# Patient Record
Sex: Male | Born: 1990 | Hispanic: No | Marital: Single | Smoking: Never smoker
Health system: Southern US, Community
[De-identification: ages and names within clinical notes are randomized; demographics above are authoritative.]

## PROBLEM LIST (undated history)

## (undated) DIAGNOSIS — K219 Gastro-esophageal reflux disease without esophagitis: Secondary | ICD-10-CM

---

## 2013-01-08 ENCOUNTER — Emergency Department (HOSPITAL_COMMUNITY): Payer: Worker's Compensation

## 2013-01-08 ENCOUNTER — Encounter (HOSPITAL_COMMUNITY): Payer: Self-pay

## 2013-01-08 ENCOUNTER — Emergency Department (HOSPITAL_COMMUNITY)
Admission: EM | Admit: 2013-01-08 | Discharge: 2013-01-08 | Disposition: A | Discharge status: Home / Self Care | Payer: Worker's Compensation | Attending: Emergency Medicine | Admitting: Emergency Medicine

## 2013-01-08 DIAGNOSIS — R296 Repeated falls: Secondary | ICD-10-CM | POA: Insufficient documentation

## 2013-01-08 DIAGNOSIS — Y9339 Activity, other involving climbing, rappelling and jumping off: Secondary | ICD-10-CM | POA: Insufficient documentation

## 2013-01-08 DIAGNOSIS — S82209A Unspecified fracture of shaft of unspecified tibia, initial encounter for closed fracture: Secondary | ICD-10-CM | POA: Insufficient documentation

## 2013-01-08 DIAGNOSIS — S82201A Unspecified fracture of shaft of right tibia, initial encounter for closed fracture: Secondary | ICD-10-CM

## 2013-01-08 DIAGNOSIS — Y929 Unspecified place or not applicable: Secondary | ICD-10-CM | POA: Insufficient documentation

## 2013-01-08 LAB — BASIC METABOLIC PANEL
BUN: 20 mg/dL (ref 6–23)
Calcium: 9.1 mg/dL (ref 8.4–10.5)
GFR calc non Af Amer: >90 mL/min (ref >90)
Glucose, Bld: 135 mg/dL — ABNORMAL HIGH (ref 70–99)
Sodium: 140 mEq/L (ref 135–145)

## 2013-01-08 LAB — CBC WITH DIFFERENTIAL/PLATELET
Eosinophils Absolute: 0 10*3/uL (ref 0.0–0.7)
Eosinophils Relative: 0 % (ref 0–5)
Lymphs Abs: 1.4 10*3/uL (ref 0.7–4.0)
MCH: 30.4 pg (ref 26.0–34.0)
MCV: 88.2 fL (ref 78.0–100.0)
Platelets: 275 10*3/uL (ref 150–400)
RBC: 4.93 MIL/uL (ref 4.22–5.81)

## 2013-01-08 MED ORDER — OXYCODONE-ACETAMINOPHEN 5-325 MG PO TABS: 2.0000 | ORAL_TABLET | ORAL | Status: DC | PRN

## 2013-01-08 MED ORDER — HYDROMORPHONE HCL PF 1 MG/ML IJ SOLN
1.0000 mg | Freq: Once | INTRAMUSCULAR | Status: AC
  Administered 2013-01-08: 1 mg via INTRAVENOUS
  Filled 2013-01-08: qty 1

## 2013-01-08 MED ORDER — SODIUM CHLORIDE 0.9 % IV BOLUS (SEPSIS)
500.0000 mL | Freq: Once | INTRAVENOUS | Status: AC
  Administered 2013-01-08: 500 mL via INTRAVENOUS

## 2013-01-08 MED ORDER — OXYCODONE-ACETAMINOPHEN 5-325 MG PO TABS: 1.0000 | ORAL_TABLET | Freq: Four times a day (QID) | ORAL | Status: DC | PRN

## 2013-01-08 MED ORDER — ONDANSETRON HCL 4 MG/2ML IJ SOLN
INTRAMUSCULAR | Status: AC
  Administered 2013-01-08: 4 mg
  Filled 2013-01-08: qty 2
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
## 2013-01-08 NOTE — ED Notes (Signed)
Dr. Rubin Payor at bedside speaking to patient via translator telephone. Er tech at bedside to apply splint.

## 2013-01-08 NOTE — ED Notes (Signed)
NPO.  Last meal at 11 am today per interpreter at bedside.  No allergies, no medications, no medical history.

## 2013-01-09 ENCOUNTER — Encounter (HOSPITAL_COMMUNITY): Payer: Self-pay | Admitting: Pharmacy Technician

## 2013-01-09 ENCOUNTER — Encounter: Payer: Self-pay | Admitting: Orthopedic Surgery

## 2013-01-09 ENCOUNTER — Encounter (HOSPITAL_COMMUNITY): Payer: Self-pay

## 2013-01-09 ENCOUNTER — Ambulatory Visit (INDEPENDENT_AMBULATORY_CARE_PROVIDER_SITE_OTHER): Payer: Worker's Compensation | Admitting: Orthopedic Surgery

## 2013-01-09 ENCOUNTER — Telehealth: Payer: Self-pay | Admitting: Orthopedic Surgery

## 2013-01-09 ENCOUNTER — Encounter (HOSPITAL_COMMUNITY)
Admission: RE | Admit: 2013-01-09 | Discharge: 2013-01-09 | Disposition: A | Discharge status: Home / Self Care | Payer: Worker's Compensation | Source: Ambulatory Visit | Attending: Orthopedic Surgery | Admitting: Orthopedic Surgery

## 2013-01-09 VITALS — BP 132/78 | Wt 180.0 lb

## 2013-01-09 DIAGNOSIS — S82209A Unspecified fracture of shaft of unspecified tibia, initial encounter for closed fracture: Secondary | ICD-10-CM

## 2013-01-09 DIAGNOSIS — S82201A Unspecified fracture of shaft of right tibia, initial encounter for closed fracture: Secondary | ICD-10-CM
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
## 2013-01-09 NOTE — Patient Instructions (Addendum)
Fractura de tibia y peron en adultos (Tibial and Fibular Fracture, Adult) Usted ha sufrido una fractura (quebradura de hueso) en la tibia y el peron. La tibia es el hueso grande o "canilla" que se encuentra en la parte inferior de la pierna. Es el principal hueso que soporta su peso. El peron es el hueso de la parte externa de la pierna y el que forma el bulto en la parte exterior del [place]. Estas fracturas se diagnostican con radiografas. TRATAMIENTO Usted ha sufrido una fractura simple, que generalmente se cura sin causar ninguna discapacidad. Pero puede tratarse con una inmovilizacin simple. Esto significa que el hueso puede ser ayudado a curarse con un yeso o tablilla en una posicin favorable, hasta que el mdico considere que se encuentra estable (que se ha curado bien) para Conservator, museum/gallery. Luego puede comenzar con ejercicios de amplitud de movimiento para Pharmacologist la movilidad de la rodilla. INSTRUCCIONES PARA EL CUIDADO DOMICILIARIO  Aplique el hielo en la lesin durante 15 a 20 minutos, 3 a 4 veces por da [address] est despierto, [employer]. Coloque el hielo en una bolsa plstica y ponga una toalla delgada entre la bolsa y [place].  Si usted tiene un yeso o fibra de vidrio:  No trate de rascarse la piel por debajo del molde utilizando objetos filosos o puntiagudos.  Controle todos los [employer] piel de alrededor del yeso. Puede colocarse una locin en las zonas rojas o doloridas.  Mantenga el yeso seco y limpio.  Si usted tiene una tablilla de yeso.  sela del modo en que se lo indicaron.  Puede aflojar el elstico que rodea la tablilla si los dedos se entumecen, siente hormigueos, se enfran o se vuelven de color azul.  No ejerza presin en ninguna parte del yeso o tablilla hasta que se haya endurecido, ya que podra deformarse.  Puede proteger el yeso o la tablilla durante el bao con una bolsa plstica. No los sumerja en el agua.  Utilice las [employer]  del modo en que se lo indicaron.  Slo tome medicamentos de venta libre o prescriptos para Primary school teacher, las [place], o bajar la fiebre segn las indicaciones de su mdico.  Siga todas las instrucciones del profesional que lo asiste, concierte y cumpla con las citas y use las [employer] se le indic.  Concurra a la Training and development officer profesional que lo asiste de acuerdo a lo que le haya indicado. Es muy importante concurrir a todas las interconsultas y citas con el objeto de Automotive engineer problemas a Air cabin crew en la pierna y la rodilla, como dolor crnico o imposibilidad de Licensed conveyancer tobillo normalmente, el fracaso en la curacin de la fractura o una discapacidad [place]. SOLICITE ATENCIN MDICA DE INMEDIATO SI:  El dolor empeora en lugar de mejorar, o si el dolor no es controlable con los medicamentos.  Aumenta la hinchazn o el enrojecimiento en el pie.  Comienza a perder la sensibilidad en el pie o en los dedos.  El pie o los dedos del lado lesionado se tornan fros o de color azul  Siente dolor intenso en la pierna lesionada, especialmente si aumenta el dolor con el movimiento de los dedos. Document Released: 02/08/2005 Document Revised: 07/24/2011 [facility] Patient Information 2014 [place], [place]. Tibial Fracture, Adult You have a fracture (break in bone) of your tibia. This is the large "shin" bone in your lower leg. These fractures are easily diagnosed with x-rays. TREATMENT  You have a simple fracture which  usually will heal without disability. It can be treated with simple immobilization. This means the bone can be held with a cast or splint in a favorable position until your caregiver feels it is stable (healed well enough). Then you can begin range of motion exercises to keep your knee and ankle limber (moving well). HOME CARE INSTRUCTIONS   Apply ice to the injury for 15-20 minutes, 3-4 times per day while awake, for 2 days. Put the ice in a plastic bag and place a thin towel  between the bag of ice and your cast.  If you have a plaster or fiberglass cast:  Do not try to scratch the skin under the cast using sharp or pointed objects.  Check the skin around the cast every day. You may put lotion on any red or sore areas.  Keep your cast dry and clean.  If you have a plaster splint:  Wear the splint as directed.  You may loosen the elastic around the splint if your toes become numb, tingle, or turn cold or blue.  Do not put pressure on any part of your cast or splint until it is fully hardened.  Your cast or splint can be protected during bathing with a plastic bag. Do not lower the cast or splint into water.  Use crutches as directed.  Only take over-the-counter or prescription medicines for pain, discomfort, or fever as directed by your caregiver.  See your caregiver as directed. It is very important to keep all follow-up referrals and appointments in order to avoid any long-term problems with your leg and ankle including chronic pain, inability to move the ankle normally, failure of the fracture to heal and permanent disability. SEEK IMMEDIATE MEDICAL CARE IF:   Pain is becoming worse rather than better, or if pain is uncontrolled with medications.  You have increased swelling, pain, or redness in the foot.  You begin to lose feeling in your foot or toes.  You develop a cold or blue foot or toes on the injured side.  You develop severe pain in your injured leg, especially if it is increased with movement of your toes. Document Released: 01/24/2001 Document Revised: 07/24/2011 Document Reviewed: 08/18/2008 [facility] Patient Information 2014 [place], [place].  Terry Reeves  01/09/2013   Your procedure is scheduled on:  01/10/13  Report to [facility] at 1100 AM.  Call this number if you have problems the morning of surgery: [phone]   Remember:   Do not eat food or drink liquids after midnight.   Take these medicines the morning of  surgery with A SIP OF WATER: pain pill if needed   Do not wear jewelry, make-up or nail polish.  Do not wear lotions, powders, or perfumes. You may wear deodorant.  Do not shave 48 hours prior to surgery. Men may shave face and neck.  Do not bring valuables to the hospital.  [facility] is not responsible                   for any belongings or valuables.  Contacts, dentures or bridgework may not be worn into surgery.  Leave suitcase in the car. After surgery it may be brought to your room.  For patients admitted to the hospital, checkout time is 11:00 AM the day of  discharge.   Patients discharged the day of surgery will not be allowed to drive  home.  Name and phone number of your driver: friend  Special Instructions: Shower using CHG  2 nights before surgery and the night before surgery.  If you shower the day of surgery use CHG.  Use special wash - you have one bottle of CHG for all showers.  You should use approximately 1/3 of the bottle for each shower.   Please read over the following fact sheets that you were given: Pain Booklet, Anesthesia Post-op Instructions and Care and Recovery After Surgery
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
## 2013-01-10 ENCOUNTER — Encounter (HOSPITAL_COMMUNITY): Payer: Self-pay | Admitting: *Deleted

## 2013-01-10 ENCOUNTER — Ambulatory Visit (HOSPITAL_COMMUNITY): Payer: Worker's Compensation

## 2013-01-10 ENCOUNTER — Encounter (HOSPITAL_COMMUNITY)
Admission: RE | Disposition: A | Discharge status: Home / Self Care | Payer: Self-pay | Source: Ambulatory Visit | Attending: Orthopedic Surgery

## 2013-01-10 ENCOUNTER — Encounter (HOSPITAL_COMMUNITY): Payer: Self-pay | Admitting: Anesthesiology

## 2013-01-10 ENCOUNTER — Ambulatory Visit (HOSPITAL_COMMUNITY): Payer: Worker's Compensation | Admitting: Anesthesiology

## 2013-01-10 ENCOUNTER — Inpatient Hospital Stay (HOSPITAL_COMMUNITY)
Admission: RE | Admit: 2013-01-10 | Discharge: 2013-01-11 | DRG: 494 | Disposition: A | Discharge status: Home / Self Care | Payer: Worker's Compensation | Source: Ambulatory Visit | Attending: Orthopedic Surgery | Admitting: Orthopedic Surgery

## 2013-01-10 DIAGNOSIS — Z8249 Family history of ischemic heart disease and other diseases of the circulatory system: Secondary | ICD-10-CM

## 2013-01-10 DIAGNOSIS — Y990 Civilian activity done for income or pay: Secondary | ICD-10-CM

## 2013-01-10 DIAGNOSIS — S82201A Unspecified fracture of shaft of right tibia, initial encounter for closed fracture: Secondary | ICD-10-CM

## 2013-01-10 DIAGNOSIS — S82209A Unspecified fracture of shaft of unspecified tibia, initial encounter for closed fracture: Principal | ICD-10-CM

## 2013-01-10 HISTORY — PX: TIBIA IM NAIL INSERTION: SHX2516

## 2013-01-10 HISTORY — DX: Gastro-esophageal reflux disease without esophagitis: K21.9

## 2013-01-10 LAB — CBC
MCH: 30.7 pg (ref 26.0–34.0)
MCHC: 34.6 g/dL (ref 30.0–36.0)
MCV: 88.8 fL (ref 78.0–100.0)
Platelets: 237 10*3/uL (ref 150–400)
RBC: 4.75 MIL/uL (ref 4.22–5.81)

## 2013-01-10 LAB — CREATININE, SERUM: Creatinine, Ser: 0.95 mg/dL (ref 0.50–1.35)

## 2013-01-10 SURGERY — INSERTION, INTRAMEDULLARY ROD, TIBIA
Anesthesia: General | Site: Leg Lower | Laterality: Right | Wound class: Clean

## 2013-01-10 MED ORDER — ALUM & MAG HYDROXIDE-SIMETH 200-200-20 MG/5ML PO SUSP: 30.0000 mL | ORAL | Status: DC | PRN

## 2013-01-10 MED ORDER — ENOXAPARIN SODIUM 40 MG/0.4ML ~~LOC~~ SOLN
40.0000 mg | SUBCUTANEOUS | Status: DC
  Administered 2013-01-11: 40 mg via SUBCUTANEOUS
  Filled 2013-01-10: qty 0.4

## 2013-01-10 MED ORDER — CHLORHEXIDINE GLUCONATE 4 % EX LIQD: 60.0000 mL | Freq: Once | CUTANEOUS | Status: DC

## 2013-01-10 MED ORDER — MENTHOL 3 MG MT LOZG: 1.0000 | LOZENGE | OROMUCOSAL | Status: DC | PRN

## 2013-01-10 MED ORDER — PHENOL 1.4 % MT LIQD: 1.0000 | OROMUCOSAL | Status: DC | PRN

## 2013-01-10 MED ORDER — METOCLOPRAMIDE HCL 5 MG/ML IJ SOLN: 5.0000 mg | Freq: Three times a day (TID) | INTRAMUSCULAR | Status: DC | PRN

## 2013-01-10 MED ORDER — BUPIVACAINE-EPINEPHRINE PF 0.5-1:200000 % IJ SOLN
INTRAMUSCULAR | Status: AC
  Filled 2013-01-10: qty 20

## 2013-01-10 MED ORDER — ACETAMINOPHEN 325 MG PO TABS: 650.0000 mg | ORAL_TABLET | Freq: Four times a day (QID) | ORAL | Status: DC | PRN

## 2013-01-10 MED ORDER — PROPOFOL 10 MG/ML IV EMUL
INTRAVENOUS | Status: AC
  Filled 2013-01-10: qty 20

## 2013-01-10 MED ORDER — ACETAMINOPHEN 650 MG RE SUPP: 650.0000 mg | Freq: Four times a day (QID) | RECTAL | Status: DC | PRN

## 2013-01-10 MED ORDER — CEFAZOLIN SODIUM-DEXTROSE 2-3 GM-% IV SOLR
INTRAVENOUS | Status: AC
  Filled 2013-01-10: qty 50

## 2013-01-10 MED ORDER — ONDANSETRON HCL 4 MG/2ML IJ SOLN: 4.0000 mg | Freq: Four times a day (QID) | INTRAMUSCULAR | Status: DC | PRN

## 2013-01-10 MED ORDER — PROPOFOL 10 MG/ML IV BOLUS
INTRAVENOUS | Status: DC | PRN
  Administered 2013-01-10: 150 mg via INTRAVENOUS

## 2013-01-10 MED ORDER — FENTANYL CITRATE 0.05 MG/ML IJ SOLN
INTRAMUSCULAR | Status: AC
  Filled 2013-01-10: qty 2

## 2013-01-10 MED ORDER — CEFAZOLIN SODIUM-DEXTROSE 2-3 GM-% IV SOLR
2.0000 g | INTRAVENOUS | Status: AC
  Administered 2013-01-10: 2 g via INTRAVENOUS

## 2013-01-10 MED ORDER — SODIUM CHLORIDE 0.9 % IR SOLN
Status: DC | PRN
  Administered 2013-01-10 (×2): 1000 mL

## 2013-01-10 MED ORDER — OXYCODONE-ACETAMINOPHEN 5-325 MG PO TABS
1.0000 | ORAL_TABLET | ORAL | Status: DC
  Administered 2013-01-10 – 2013-01-11 (×5): 1 via ORAL
  Filled 2013-01-10 (×5): qty 1

## 2013-01-10 MED ORDER — ROCURONIUM BROMIDE 100 MG/10ML IV SOLN
INTRAVENOUS | Status: DC | PRN
  Administered 2013-01-10: 10 mg via INTRAVENOUS
  Administered 2013-01-10: 5 mg via INTRAVENOUS
  Administered 2013-01-10: 35 mg via INTRAVENOUS

## 2013-01-10 MED ORDER — HYDROMORPHONE HCL PF 1 MG/ML IJ SOLN
0.5000 mg | INTRAMUSCULAR | Status: DC | PRN
  Administered 2013-01-10 – 2013-01-11 (×6): 0.5 mg via INTRAVENOUS
  Filled 2013-01-10 (×6): qty 1

## 2013-01-10 MED ORDER — FENTANYL CITRATE 0.05 MG/ML IJ SOLN
INTRAMUSCULAR | Status: DC | PRN
  Administered 2013-01-10: 100 ug via INTRAVENOUS
  Administered 2013-01-10 (×3): 50 ug via INTRAVENOUS
  Administered 2013-01-10 (×2): 100 ug via INTRAVENOUS
  Administered 2013-01-10: 50 ug via INTRAVENOUS

## 2013-01-10 MED ORDER — FENTANYL CITRATE 0.05 MG/ML IJ SOLN
25.0000 ug | INTRAMUSCULAR | Status: DC | PRN
  Administered 2013-01-10 (×2): 50 ug via INTRAVENOUS

## 2013-01-10 MED ORDER — FENTANYL CITRATE 0.05 MG/ML IJ SOLN
25.0000 ug | INTRAMUSCULAR | Status: AC
  Administered 2013-01-10 (×2): 25 ug via INTRAVENOUS

## 2013-01-10 MED ORDER — SODIUM CHLORIDE 0.9 % IV SOLN
INTRAVENOUS | Status: DC
  Administered 2013-01-10: 16:00:00 via INTRAVENOUS

## 2013-01-10 MED ORDER — METOCLOPRAMIDE HCL 10 MG PO TABS
5.0000 mg | ORAL_TABLET | Freq: Three times a day (TID) | ORAL | Status: DC | PRN
  Filled 2013-01-10: qty 2

## 2013-01-10 MED ORDER — BUPIVACAINE-EPINEPHRINE 0.5% -1:200000 IJ SOLN
INTRAMUSCULAR | Status: DC | PRN
  Administered 2013-01-10: 60 mL

## 2013-01-10 MED ORDER — MIDAZOLAM HCL 2 MG/2ML IJ SOLN
INTRAMUSCULAR | Status: AC
  Filled 2013-01-10: qty 2

## 2013-01-10 MED ORDER — NEOSTIGMINE METHYLSULFATE 1 MG/ML IJ SOLN
INTRAMUSCULAR | Status: DC | PRN
  Administered 2013-01-10 (×2): 1 mg via INTRAVENOUS

## 2013-01-10 MED ORDER — ROCURONIUM BROMIDE 50 MG/5ML IV SOLN
INTRAVENOUS | Status: AC
  Filled 2013-01-10: qty 1

## 2013-01-10 MED ORDER — MIDAZOLAM HCL 2 MG/2ML IJ SOLN
1.0000 mg | INTRAMUSCULAR | Status: DC | PRN
  Administered 2013-01-10 (×2): 2 mg via INTRAVENOUS

## 2013-01-10 MED ORDER — ONDANSETRON HCL 4 MG/2ML IJ SOLN: 4.0000 mg | Freq: Once | INTRAMUSCULAR | Status: DC | PRN

## 2013-01-10 MED ORDER — CEFAZOLIN SODIUM-DEXTROSE 2-3 GM-% IV SOLR
2.0000 g | Freq: Four times a day (QID) | INTRAVENOUS | Status: AC
  Administered 2013-01-10 (×2): 2 g via INTRAVENOUS
  Filled 2013-01-10 (×2): qty 50

## 2013-01-10 MED ORDER — ONDANSETRON HCL 4 MG PO TABS: 4.0000 mg | ORAL_TABLET | Freq: Four times a day (QID) | ORAL | Status: DC | PRN

## 2013-01-10 MED ORDER — LACTATED RINGERS IV SOLN
INTRAVENOUS | Status: DC
  Administered 2013-01-10 (×2): via INTRAVENOUS

## 2013-01-10 MED ORDER — FENTANYL CITRATE 0.05 MG/ML IJ SOLN
INTRAMUSCULAR | Status: AC
  Filled 2013-01-10: qty 5

## 2013-01-10 MED ORDER — LIDOCAINE HCL (CARDIAC) 10 MG/ML IV SOLN
INTRAVENOUS | Status: DC | PRN
  Administered 2013-01-10: 20 mg via INTRAVENOUS

## 2013-01-10 SURGICAL SUPPLY — 54 items
BAG HAMPER (MISCELLANEOUS) ×2 IMPLANT
BANDAGE ELASTIC 4 VELCRO NS (GAUZE/BANDAGES/DRESSINGS) ×2 IMPLANT
BANDAGE ELASTIC 6 VELCRO NS (GAUZE/BANDAGES/DRESSINGS) ×2 IMPLANT
BANDAGE ESMARK 4X12 BL STRL LF (DISPOSABLE) ×1 IMPLANT
BANDAGE GAUZE ELAST BULKY 4 IN (GAUZE/BANDAGES/DRESSINGS) ×2 IMPLANT
BIT DRILL CALIBRATED 4.2 (BIT) ×1 IMPLANT
BIT DRILL SHORT 4.2 (BIT) ×2 IMPLANT
BNDG ESMARK 4X12 BLUE STRL LF (DISPOSABLE) ×2
CHLORAPREP W/TINT 26ML (MISCELLANEOUS) ×4 IMPLANT
CLOTH BEACON ORANGE TIMEOUT ST (SAFETY) ×2 IMPLANT
COVER LIGHT HANDLE STERIS (MISCELLANEOUS) ×4 IMPLANT
COVER MAYO STAND XLG (DRAPE) ×2 IMPLANT
CUFF TOURNIQUET SINGLE 34IN LL (TOURNIQUET CUFF) ×2 IMPLANT
CUFF TOURNIQUET SINGLE 44IN (TOURNIQUET CUFF) IMPLANT
DRAPE C-ARM FOLDED MOBILE STRL (DRAPES) ×2 IMPLANT
DRAPE PROXIMA HALF (DRAPES) ×4 IMPLANT
DRILL BIT CALIBRATED 4.2 (BIT) ×2
DRILL BIT SHORT 4.2 (BIT) ×2
GAUZE XEROFORM 5X9 LF (GAUZE/BANDAGES/DRESSINGS) ×2 IMPLANT
GLOVE BIOGEL PI IND STRL 7.0 (GLOVE) ×2 IMPLANT
GLOVE BIOGEL PI INDICATOR 7.0 (GLOVE) ×2
GLOVE ECLIPSE 7.0 STRL STRAW (GLOVE) ×2 IMPLANT
GLOVE SKINSENSE NS SZ7.0 (GLOVE) ×1
GLOVE SKINSENSE NS SZ8.0 LF (GLOVE) ×2
GLOVE SKINSENSE STRL SZ7.0 (GLOVE) ×1 IMPLANT
GLOVE SKINSENSE STRL SZ8.0 LF (GLOVE) ×2 IMPLANT
GLOVE SS N UNI LF 8.5 STRL (GLOVE) ×2 IMPLANT
GOWN STRL REIN XL XLG (GOWN DISPOSABLE) ×8 IMPLANT
INST SET MAJOR BONE (KITS) ×2 IMPLANT
KIT ROOM TURNOVER APOR (KITS) ×2 IMPLANT
MANIFOLD NEPTUNE II (INSTRUMENTS) ×2 IMPLANT
NAIL TIBIAL EX W/BEND 10X345M (Nail) ×2 IMPLANT
NEEDLE HYPO 21X1.5 SAFETY (NEEDLE) ×2 IMPLANT
NS IRRIG 1000ML POUR BTL (IV SOLUTION) ×4 IMPLANT
PACK BASIC LIMB (CUSTOM PROCEDURE TRAY) ×2 IMPLANT
PAD ABD 5X9 TENDERSORB (GAUZE/BANDAGES/DRESSINGS) IMPLANT
PAD ARMBOARD 7.5X6 YLW CONV (MISCELLANEOUS) ×2 IMPLANT
REAMER ROD DEEP FLUTE 2.5X950 (INSTRUMENTS) ×2 IMPLANT
REAMER SHAFT BIXCUT (INSTRUMENTS) ×2 IMPLANT
SCREW LOCK STAR 5X32 (Screw) ×2 IMPLANT
SCREW LOCK STAR 5X34 (Screw) ×2 IMPLANT
SCREW LOCK STAR 5X36 (Screw) ×2 IMPLANT
SCREW LOCK STAR 5X42 (Screw) ×2 IMPLANT
SET BASIN LINEN APH (SET/KITS/TRAYS/PACK) ×2 IMPLANT
SPONGE GAUZE 4X4 12PLY (GAUZE/BANDAGES/DRESSINGS) ×2 IMPLANT
SPONGE LAP 18X18 X RAY DECT (DISPOSABLE) ×2 IMPLANT
STAPLER VISISTAT 35W (STAPLE) ×2 IMPLANT
SUT MON AB 2-0 SH 27 (SUTURE) ×1
SUT MON AB 2-0 SH27 (SUTURE) ×1 IMPLANT
SUT VIC AB 1 CT1 27 (SUTURE) ×1
SUT VIC AB 1 CT1 27XBRD ANTBC (SUTURE) ×1 IMPLANT
SYR 30ML LL (SYRINGE) ×2 IMPLANT
SYR BULB IRRIGATION 50ML (SYRINGE) ×2 IMPLANT
TOWEL OR 17X26 4PK STRL BLUE (TOWEL DISPOSABLE) ×14 IMPLANT

## 2013-01-10 NOTE — Transfer of Care (Signed)
Immediate Anesthesia Transfer of Care Note  Patient: Terry Reeves  Procedure(s) Performed: Procedure(s): RIGHT INTRAMEDULLARY (IM) NAIL TIBIAL (Right)  Patient Location: PACU  Anesthesia Type:General  Level of Consciousness: sedated  Airway & Oxygen Therapy: Patient Spontanous Breathing and Patient connected to face mask oxygen  Post-op Assessment: Report given to PACU RN and Post -op Vital signs reviewed and stable  Post vital signs: Reviewed and stable  Complications: No apparent anesthesia complications

## 2013-01-10 NOTE — Op Note (Signed)
01/10/2013  2:39 PM  PATIENT:  Terry Reeves  22 y.o. male  PRE-OPERATIVE DIAGNOSIS:  right tibial fracture  POST-OPERATIVE DIAGNOSIS:  right tibial fracture  PROCEDURE:  Procedure(s): RIGHT INTRAMEDULLARY (IM) NAIL TIBIAL (Right)  SURGEON:  Surgeon(s) and Role:    * Vickki Hearing, MD - Primary    * Marlane Hatcher, MD - Assisting  PHYSICIAN ASSISTANT:   ASSISTANTS: catherine page    ANESTHESIA:   general  EBL:  Total I/O In: 1000 [I.V.:1000] Out: 0   BLOOD ADMINISTERED:none  DRAINS: none   LOCAL MEDICATIONS USED:  MARCAINE   0.5% 60cc  SPECIMEN:  No Specimen  DISPOSITION OF SPECIMEN:  N/A  COUNTS:  YES  TOURNIQUET:   Total Tourniquet Time Documented: Thigh (Right) - 98 minutes Total: Thigh (Right) - 98 minutes   DICTATION: .Reubin Milan Dictation  PLAN OF CARE: Admit to inpatient   PATIENT DISPOSITION:  PACU - hemodynamically stable.   Delay start of Pharmacological VTE agent (>24hrs) due to surgical blood loss or risk of bleeding: yes  Details of procedure Confirmation of site and site marking were performed in the preop area. The chart was reviewed and updated.  The patient went to the operating room had the appropriate antibiotics based on his weight of 81 kg and had general anesthesia.  The C-arm was brought in and radiographs were taken to confirm adequate positioning of the C-arm and patient to get appropriate films during surgery.  In the supine position sterile prep and drape was performed from the toes to the groin.  Tourniquet was inflated timeout was executed  With the knee in flexion a midline incision was made down to the patella tendon a medial arthrotomy was performed and the patella was retracted laterally. Radiographs are brought in to find the medial edge of the lateral tibial spine and the awl was placed at this point and used to enter the tibial canal.  A guidewire was placed into the canal across the fracture site with  the fracture reduced. This was done under C-arm guidance.  Serial reamings were performed from 9-9.5 increasing by 0.5 up 11.5 which gave excellent chatter.  The nail was passed under C-arm guidance across the fracture site AP and lateral x-rays confirmed reduction and nail placement. Proximal locking bolts were placed one static 1 dynamic per manufacture technique through an incision on the medial tibial face.  2 distal screws were placed through 2 stab wounds.  The wounds were thoroughly irrigated final x-rays confirmed reduction of the fracture and position of the hardware there  Proximal incision was closed with 1 Bralon for the arthrotomy 2-0 Monocryl for the subcutaneous staples for the skin the medial wound for the proximal screws was closed with 2-0 Monocryl and staples the 2 distal stab wounds were closed with 1 2-0 Monocryl and staples.  60 cc of Marcaine was injected.  Sterile bandages were applied tourniquet was released compartments were soft were rechecked after 10 minutes and continued to be soft  The patient is weightbearing as tolerated for postop protocol

## 2013-01-10 NOTE — Anesthesia Postprocedure Evaluation (Signed)
  Anesthesia Post-op Note  Patient: Terry Reeves  Procedure(s) Performed: Procedure(s): RIGHT INTRAMEDULLARY (IM) NAIL TIBIAL (Right)  Patient Location: PACU  Anesthesia Type:General  Level of Consciousness: awake, alert , oriented and patient cooperative  Airway and Oxygen Therapy: Patient Spontanous Breathing  Post-op Pain: 4 /10, moderate  Post-op Assessment: Post-op Vital signs reviewed, Patient's Cardiovascular Status Stable and Respiratory Function Stable  Post-op Vital Signs: Reviewed and stable  Complications: No apparent anesthesia complications

## 2013-01-10 NOTE — Anesthesia Procedure Notes (Addendum)
Procedure Name: Intubation Date/Time: 01/10/2013 12:36 PM Performed by: Franco Nones Pre-anesthesia Checklist: Patient identified, Patient being monitored, Timeout performed, Emergency Drugs available and Suction available Patient Re-evaluated:Patient Re-evaluated prior to inductionOxygen Delivery Method: Circle System Utilized Preoxygenation: Pre-oxygenation with 100% oxygen Intubation Type: IV induction, Rapid sequence and Cricoid Pressure applied Ventilation: Mask ventilation without difficulty Laryngoscope Size: Miller and 2 Grade View: Grade I Tube type: Oral Tube size: 7.0 mm Number of attempts: 1 Airway Equipment and Method: stylet and Oral airway Placement Confirmation: ETT inserted through vocal cords under direct vision,  positive ETCO2 and breath sounds checked- equal and bilateral Secured at: 21 cm Tube secured with: Tape Dental Injury: Teeth and Oropharynx as per pre-operative assessment

## 2013-01-10 NOTE — Anesthesia Preprocedure Evaluation (Signed)
Anesthesia Evaluation  Patient identified by MRN, date of birth, ID band Patient awake    Reviewed: Allergy & Precautions, H&P , NPO status , Patient's Chart, lab work & pertinent test results  Airway Mallampati: I TM Distance: >3 FB Neck ROM: Full    Dental  (+) Teeth Intact   Pulmonary neg pulmonary ROS,  breath sounds clear to auscultation        Cardiovascular negative cardio ROS  Rhythm:Regular Rate:Normal     Neuro/Psych    GI/Hepatic GERD-  ,  Endo/Other    Renal/GU      Musculoskeletal   Abdominal   Peds  Hematology   Anesthesia Other Findings   Reproductive/Obstetrics                           Anesthesia Physical Anesthesia Plan  ASA: II  Anesthesia Plan: General   Post-op Pain Management:    Induction: Intravenous, Rapid sequence and Cricoid pressure planned  Airway Management Planned: Oral ETT  Additional Equipment:   Intra-op Plan:   Post-operative Plan: Extubation in OR  Informed Consent: I have reviewed the patients History and Physical, chart, labs and discussed the procedure including the risks, benefits and alternatives for the proposed anesthesia with the patient or authorized representative who has indicated his/her understanding and acceptance.     Plan Discussed with:   Anesthesia Plan Comments:         Anesthesia Quick Evaluation

## 2013-01-10 NOTE — Interval H&P Note (Signed)
History and Physical Interval Note:  01/10/2013 11:49 AM  Terry Reeves  has presented today for surgery, with the diagnosis of right tibial fracture  The various methods of treatment have been discussed with the patient and family. After consideration of risks, benefits and other options for treatment, the patient has consented to  Procedure(s): INTRAMEDULLARY (IM) NAIL TIBIAL (Right) as a surgical intervention .  The patient's history has been reviewed, patient examined, no change in status, stable for surgery.  I have reviewed the patient's chart and labs.  Questions were answered to the patient's satisfaction.     Fuller Canada
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
## 2013-01-10 NOTE — Brief Op Note (Addendum)
01/10/2013  2:39 PM  PATIENT:  Terry Reeves  22 y.o. male  PRE-OPERATIVE DIAGNOSIS:  right tibial fracture  POST-OPERATIVE DIAGNOSIS:  right tibial fracture  PROCEDURE:  Procedure(s): RIGHT INTRAMEDULLARY (IM) NAIL TIBIAL (Right)  SURGEON:  Surgeon(s) and Role:    * Stanley E Harrison, MD - Primary    * William S Bradford, MD - Assisting  PHYSICIAN ASSISTANT:   ASSISTANTS: catherine page    ANESTHESIA:   general  EBL:  Total I/O In: 1000 [I.V.:1000] Out: 0   BLOOD ADMINISTERED:none  DRAINS: none   LOCAL MEDICATIONS USED:  MARCAINE   0.5% 60cc  SPECIMEN:  No Specimen  DISPOSITION OF SPECIMEN:  N/A  COUNTS:  YES  TOURNIQUET:   Total Tourniquet Time Documented: Thigh (Right) - 98 minutes Total: Thigh (Right) - 98 minutes   DICTATION: .Dragon Dictation  PLAN OF CARE: Admit to inpatient   PATIENT DISPOSITION:  PACU - hemodynamically stable.   Delay start of Pharmacological VTE agent (>24hrs) due to surgical blood loss or risk of bleeding: yes  Details of procedure Confirmation of site and site marking were performed in the preop area. The chart was reviewed and updated.  The patient went to the operating room had the appropriate antibiotics based on his weight of 81 kg and had general anesthesia.  The C-arm was brought in and radiographs were taken to confirm adequate positioning of the C-arm and patient to get appropriate films during surgery.  In the supine position sterile prep and drape was performed from the toes to the groin.  Tourniquet was inflated timeout was executed  With the knee in flexion a midline incision was made down to the patella tendon a medial arthrotomy was performed and the patella was retracted laterally. Radiographs are brought in to find the medial edge of the lateral tibial spine and the awl was placed at this point and used to enter the tibial canal.  A guidewire was placed into the canal across the fracture site with  the fracture reduced. This was done under C-arm guidance.  Serial reamings were performed from 9-9.5 increasing by 0.5 up 11.5 which gave excellent chatter.  The nail was passed under C-arm guidance across the fracture site AP and lateral x-rays confirmed reduction and nail placement. Proximal locking bolts were placed one static 1 dynamic per manufacture technique through an incision on the medial tibial face.  2 distal screws were placed through 2 stab wounds.  The wounds were thoroughly irrigated final x-rays confirmed reduction of the fracture and position of the hardware there  Proximal incision was closed with 1 Bralon for the arthrotomy 2-0 Monocryl for the subcutaneous staples for the skin the medial wound for the proximal screws was closed with 2-0 Monocryl and staples the 2 distal stab wounds were closed with 1 2-0 Monocryl and staples.  60 cc of Marcaine was injected.  Sterile bandages were applied tourniquet was released compartments were soft were rechecked after 10 minutes and continued to be soft  The patient is weightbearing as tolerated for postop protocol 

## 2013-01-11 LAB — CBC
HCT: 41.6 % (ref 39.0–52.0)
MCH: 30.1 pg (ref 26.0–34.0)
MCV: 88.7 fL (ref 78.0–100.0)
Platelets: 236 10*3/uL (ref 150–400)
RBC: 4.69 MIL/uL (ref 4.22–5.81)
WBC: 13.1 10*3/uL — ABNORMAL HIGH (ref 4.0–10.5)

## 2013-01-11 MED ORDER — OXYCODONE-ACETAMINOPHEN 5-325 MG PO TABS: 1.0000 | ORAL_TABLET | ORAL | Status: AC | PRN

## 2013-01-11 NOTE — Progress Notes (Signed)
Subjective: 1 Day Post-Op Procedure(s) (LRB): RIGHT INTRAMEDULLARY (IM) NAIL TIBIAL (Right) Patient reports pain as mild.    Objective: Vital signs in last 24 hours: Temp:  [97.6 F (36.4 C)-99.7 F (37.6 C)] 98.8 F (37.1 C) (08/30 0555) Pulse Rate:  [94-109] 96 (08/30 0555) Resp:  [16-27] 18 (08/30 0555) BP: (113-159)/(68-109) 139/72 mmHg (08/30 0555) SpO2:  [22 %-100 %] 98 % (08/30 0555) Weight:  [180 lb (81.647 kg)] 180 lb (81.647 kg) (08/29 1132)  Intake/Output from previous day: 08/29 0701 - 08/30 0700 In: 3380 [P.O.:1680; I.V.:1700] Out: 4275 [Urine:4275] Intake/Output this shift:     Recent Labs  01/08/13 1858 01/10/13 1654 01/11/13 0527  HGB 15.0 14.6 14.1    Recent Labs  01/10/13 1654 01/11/13 0527  WBC 12.2* 13.1*  RBC 4.75 4.69  HCT 42.2 41.6  PLT 237 236    Recent Labs  01/08/13 1858 01/10/13 1654  NA 140  --   K 3.6  --   CL 104  --   CO2 24  --   BUN 20  --   CREATININE 1.12 0.95  GLUCOSE 135*  --   CALCIUM 9.1  --    No results found for this basename: LABPT, INR,  in the last 72 hours  Neurologically intact  Assessment/Plan: 1 Day Post-Op Procedure(s) (LRB): RIGHT INTRAMEDULLARY (IM) NAIL TIBIAL (Right) Discharge today  Fuller Canada 01/11/2013, 7:41 AM

## 2013-01-11 NOTE — Progress Notes (Addendum)
Interpreter services utilized to explain d/c instructions, new medication, and necessary follow up appt w/Dr. Romeo Apple. Pt has no questions at this time, and is to follow up with Dr. Romeo Apple next Tuesday at 8:45am. Pt verbalizes clear understanding.  IV was d/c. Pt went home with crutches. His boss transported patient home. Pt was d/c via wheelchair, accompanied by NT. Prescriptions and d/c paperwork in hand at time of d/c. Sheryn Bison

## 2013-01-11 NOTE — Plan of Care (Signed)
Problem: Phase I Progression Outcomes Goal: Sutures/staples intact Outcome: Not Applicable Date Met:  01/11/13 Unable to assess d/t surgical dressing.   Problem: Phase II Progression Outcomes Goal: Sutures/staples intact Outcome: Not Applicable Date Met:  01/11/13 Unable to assess.

## 2013-01-11 NOTE — Anesthesia Postprocedure Evaluation (Signed)
  Anesthesia Post-op Note  Patient: Terry Reeves  Procedure(s) Performed: Procedure(s): RIGHT INTRAMEDULLARY (IM) NAIL TIBIAL (Right)  Patient Location: room 332  Anesthesia Type:General  Level of Consciousness: awake, alert , oriented and patient cooperative  Airway and Oxygen Therapy: Patient Spontanous Breathing  Post-op Pain: 5 /10, moderate  Post-op Assessment: Post-op Vital signs reviewed, Patient's Cardiovascular Status Stable, Respiratory Function Stable, Patent Airway, No signs of Nausea or vomiting and Adequate PO intake  Post-op Vital Signs: Reviewed and stable  Complications: No apparent anesthesia complications
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
## 2013-01-11 NOTE — Discharge Summary (Signed)
Physician Discharge Summary  Patient ID: DEVIAN BARTOLOMEI MRN: [id number] DOB/AGE: [birth year] 22 y.o.  Admit date: 01/10/2013 Discharge date: 01/11/2013  Admission Diagnoses: Closed right tibia fibula fracture  Discharge Diagnoses: Same Active Problems:   * No active hospital problems. *   Discharged Condition: good  Hospital Course:  On August 29 the patient had an uncomplicated intramedullary nailing of his right tibial fracture with a Synthes intramedullary locked nail. He tolerated therapy well on the 30th and was discharged home.  Consults: None  Significant Diagnostic Studies: None  Treatments: surgery  Discharge Exam: Blood pressure 139/72, pulse 96, temperature 98.8 F (37.1 C), temperature source Oral, resp. rate 18, height 5\' 8"  (1.727 m), weight 180 lb (81.647 kg), SpO2 98.00%. General appearance: alert Incision/Wound: Dressing dry and intactCompartment softneurovascular exam shows he can move his toes and dorsiflex foot in plantar flex his foot. He has no pain on passive stretch out of the ordinary.   Disposition: 01-Home or Self Care  Discharge Orders   Future Appointments Provider Department Dept Phone   01/14/2013 8:45 AM Vickki Hearing, MD [facility] [phone]   Future Orders Complete By Expires   Diet - low sodium heart healthy  As directed    Discharge instructions  As directed    Comments:     WEIGHT BEARING AS TOLERATED   DR TO CHANGE DRESSING   Increase activity slowly  As directed        Medication List         oxyCODONE-acetaminophen 5-325 MG per tablet  Commonly known as:  ROXICET  Take 1 tablet by mouth every 4 (four) hours as needed for pain.           Follow-up Information   Follow up with Fuller Canada, MD On 01/15/2013.   Specialties:  Orthopedic Surgery, Radiology   Contact information:   [address] [place] [address] [phone]       Signed: Fuller Canada 01/11/2013, 11:42 AM

## 2013-01-12 ENCOUNTER — Other Ambulatory Visit (HOSPITAL_COMMUNITY): Payer: Self-pay | Admitting: Orthopedic Surgery

## 2013-01-14 ENCOUNTER — Ambulatory Visit (INDEPENDENT_AMBULATORY_CARE_PROVIDER_SITE_OTHER): Payer: Worker's Compensation | Admitting: Orthopedic Surgery

## 2013-01-14 ENCOUNTER — Encounter: Payer: Self-pay | Admitting: Orthopedic Surgery

## 2013-01-14 VITALS — BP 160/88 | Ht 65.0 in | Wt 180.0 lb

## 2013-01-14 DIAGNOSIS — S8290XD Unspecified fracture of unspecified lower leg, subsequent encounter for closed fracture with routine healing: Secondary | ICD-10-CM

## 2013-01-14 DIAGNOSIS — S82201D Unspecified fracture of shaft of right tibia, subsequent encounter for closed fracture with routine healing: Secondary | ICD-10-CM

## 2013-01-14 NOTE — Progress Notes (Signed)
Subjective:     Patient ID: Terry Reeves, male   DOB: 09/07/1990, 22 y.o.   MRN: [id number] Chief Complaint  Patient presents with  . Follow-up    Post op #1 Tib Fib fracture DOS 01/10/13    HPI  Post op right tibial nail for wound check  Review of Systems     Objective:   Physical Exam Soft compartments Mild swelling  Wounds are clean     Assessment:     S/p right locked tibial nailing      Plan:     Return in 12 days for stales out and xrays   Continue WBAT

## 2013-01-14 NOTE — Progress Notes (Signed)
UR chart review completed.  

## 2013-01-14 NOTE — Patient Instructions (Addendum)
Continue crutches and weight bearing as tolerated   Apply ice to the knee as needed for swelling

## 2013-01-16 ENCOUNTER — Ambulatory Visit: Payer: Self-pay | Admitting: Orthopedic Surgery

## 2013-01-20 MED FILL — Oxycodone w/ Acetaminophen Tab 5-325 MG: ORAL | Qty: 6 | Status: AC

## 2013-01-23 ENCOUNTER — Ambulatory Visit (INDEPENDENT_AMBULATORY_CARE_PROVIDER_SITE_OTHER): Payer: Worker's Compensation | Admitting: Orthopedic Surgery

## 2013-01-23 ENCOUNTER — Ambulatory Visit (INDEPENDENT_AMBULATORY_CARE_PROVIDER_SITE_OTHER): Payer: Worker's Compensation

## 2013-01-23 ENCOUNTER — Encounter: Payer: Self-pay | Admitting: Orthopedic Surgery

## 2013-01-23 DIAGNOSIS — S8290XD Unspecified fracture of unspecified lower leg, subsequent encounter for closed fracture with routine healing: Secondary | ICD-10-CM

## 2013-01-23 DIAGNOSIS — S82201D Unspecified fracture of shaft of right tibia, subsequent encounter for closed fracture with routine healing: Secondary | ICD-10-CM

## 2013-01-23 MED ORDER — HYDROCODONE-ACETAMINOPHEN 10-325 MG PO TABS: 1.0000 | ORAL_TABLET | ORAL | Status: DC | PRN

## 2013-01-23 NOTE — Patient Instructions (Addendum)
-   Start therapy

## 2013-01-23 NOTE — Progress Notes (Signed)
Patient ID: Terry Reeves, male   DOB: December 09, 1990, 22 y.o.   MRN: [id number]  Chief Complaint  Patient presents with  . Leg Injury    POP rt tib fib nail 8.29.14    This is the second postoperative visit status post right tib-fib fracture with nailing the patient is now 13 days postop his staples were taken out his x-ray shows good alignment of his fracture he is noted to have normal wounds he does have some foot swelling and stiffness in the Achilles and ankle joint which I've encouraged him to work on and because of that we're sending him to therapy and getting him in a TED hose to control swelling  He is weightbearing as tolerated return in 4 weeks for x-ray

## 2013-01-24 ENCOUNTER — Telehealth: Payer: Self-pay | Admitting: Orthopedic Surgery

## 2013-01-24 ENCOUNTER — Encounter: Payer: Self-pay | Admitting: Orthopedic Surgery

## 2013-01-24 NOTE — Telephone Encounter (Signed)
Contact to Workers comp insurer, [facility], direct # to [employer], ph# [phone] regarding request for physical therapy approval and in-network provider-voice message left for her to return call.  Faxed order and office notes DOS 01/23/13, to her fax # [phone] and to general [facility] fax# [phone], for review.  Patient aware of status.

## 2013-01-28 NOTE — Telephone Encounter (Signed)
01/28/13 called Workers Comp to follow up on status of therapy order, spoke with new adjuster, Michele Rockers, Ext [phone]; states she has received notes and order, and that [employer], their contracted company, is handling the scheduling.  We will be notified of the location and where to directly fax the order.

## 2013-02-05 NOTE — Telephone Encounter (Signed)
Called back to Workers [employer], [employer] conservation officer, spoke with adjuster Jone Baseman, who states we can fax the therapy order to [employer], fax # [phone], as they are in process of contacting the patient, and will advise of the approved location  to have it done.  Faxed, and noted that patient requires therapy to be started as soon as possible.

## 2013-02-12 NOTE — Telephone Encounter (Signed)
No reply back regarding therapy directly from Workers Comp or from [employer] as of today, 02/12/13; called patient's employer, Larina Earthly of [employer], and she offered the information that patient was contacted and approved for therapy at [facility] [place], and began therapy today.

## 2013-02-18 ENCOUNTER — Ambulatory Visit (INDEPENDENT_AMBULATORY_CARE_PROVIDER_SITE_OTHER): Payer: Worker's Compensation

## 2013-02-18 ENCOUNTER — Encounter: Payer: Self-pay | Admitting: Orthopedic Surgery

## 2013-02-18 ENCOUNTER — Ambulatory Visit (INDEPENDENT_AMBULATORY_CARE_PROVIDER_SITE_OTHER): Payer: Worker's Compensation | Admitting: Orthopedic Surgery

## 2013-02-18 VITALS — BP 119/74 | Ht 65.0 in | Wt 180.0 lb

## 2013-02-18 DIAGNOSIS — S82201D Unspecified fracture of shaft of right tibia, subsequent encounter for closed fracture with routine healing: Secondary | ICD-10-CM

## 2013-02-18 DIAGNOSIS — S8290XD Unspecified fracture of unspecified lower leg, subsequent encounter for closed fracture with routine healing: Secondary | ICD-10-CM

## 2013-02-18 MED ORDER — HYDROCODONE-ACETAMINOPHEN 10-325 MG PO TABS: 1.0000 | ORAL_TABLET | ORAL | Status: DC | PRN

## 2013-02-18 NOTE — Progress Notes (Signed)
Patient ID: Terry Reeves, male   DOB: Sep 06, 1990, 22 y.o.   MRN: [id number]  Chief Complaint  Patient presents with  . Follow-up    4 week recheck on right tib fib nail  POP, DOS 01-10-13. Xray today.    This is day 39. Tibial nailing right leg  Doing well after therapy in terms of weightbearing range of motion at the ankle and knee. No tenderness in the knee or the ankle mild tenderness at the fracture site  X-ray shows excellent position of the nail and the lateral view slight angulation on the AP view. I would not re\re operate for this amount of angulation.  Recommend continue weightbearing as tolerated return will be prior to his leaving back to [place].   Encounter Diagnoses  Name Primary?  . Tibia/fibula fracture, right, closed, with routine healing, subsequent encounter Yes  . Right tibial fracture, closed, with routine healing, subsequent encounter     Meds ordered this encounter  Medications  . HYDROcodone-acetaminophen (NORCO) 10-325 MG per tablet    Sig: Take 1 tablet by mouth every 4 (four) hours as needed for pain.    Dispense:  60 tablet    Refill:  0

## 2013-02-18 NOTE — Patient Instructions (Signed)
Weightbearing as tolerated!

## 2013-03-13 ENCOUNTER — Encounter: Payer: Self-pay | Admitting: Orthopedic Surgery

## 2013-03-13 ENCOUNTER — Ambulatory Visit (INDEPENDENT_AMBULATORY_CARE_PROVIDER_SITE_OTHER): Payer: Worker's Compensation

## 2013-03-13 ENCOUNTER — Ambulatory Visit (INDEPENDENT_AMBULATORY_CARE_PROVIDER_SITE_OTHER): Payer: Worker's Compensation | Admitting: Orthopedic Surgery

## 2013-03-13 VITALS — BP 121/95 | Ht 65.0 in | Wt 180.0 lb

## 2013-03-13 DIAGNOSIS — S82201D Unspecified fracture of shaft of right tibia, subsequent encounter for closed fracture with routine healing: Secondary | ICD-10-CM

## 2013-03-13 DIAGNOSIS — S8290XD Unspecified fracture of unspecified lower leg, subsequent encounter for closed fracture with routine healing: Secondary | ICD-10-CM

## 2013-03-13 MED ORDER — HYDROCODONE-ACETAMINOPHEN 5-325 MG PO TABS: 1.0000 | ORAL_TABLET | Freq: Four times a day (QID) | ORAL | Status: AC | PRN

## 2013-03-13 NOTE — Patient Instructions (Signed)
Follow up in [place] need xrays in feb 2015

## 2013-03-13 NOTE — Progress Notes (Signed)
Patient ID: Terry Reeves, male   DOB: 1991-04-10, 22 y.o.   MRN: [id number] Chief Complaint  Patient presents with  . Follow-up    Right TIB/FIB fracture DOS 01/10/13    Post op day 62   C/o mild pain at ankle and fracture site   Knee rom = 125 ankle motion normal;   He is wearing compression hose for edema control   todays xrays show proper alignment and expected progression toward  healing of the fracture   rec xrays in feb 2015   Meds ordered this encounter  Medications  . HYDROcodone-acetaminophen (NORCO/VICODIN) 5-325 MG per tablet    Sig: Take 1 tablet by mouth every 6 (six) hours as needed for pain.    Dispense:  60 tablet    Refill:  0

## 2013-03-17 ENCOUNTER — Ambulatory Visit: Payer: Worker's Compensation | Admitting: Orthopedic Surgery

## 2014-09-16 IMAGING — CR DG TIBIA/FIBULA 2V*R*
3 series · 3 of 3 positions shown · non-contrast
Comparison: None.

CLINICAL DATA: Recent traumatic injury with pain

RIGHT TIBIA AND FIBULA - 2 VIEW

[view not recorded (1 of 3)]
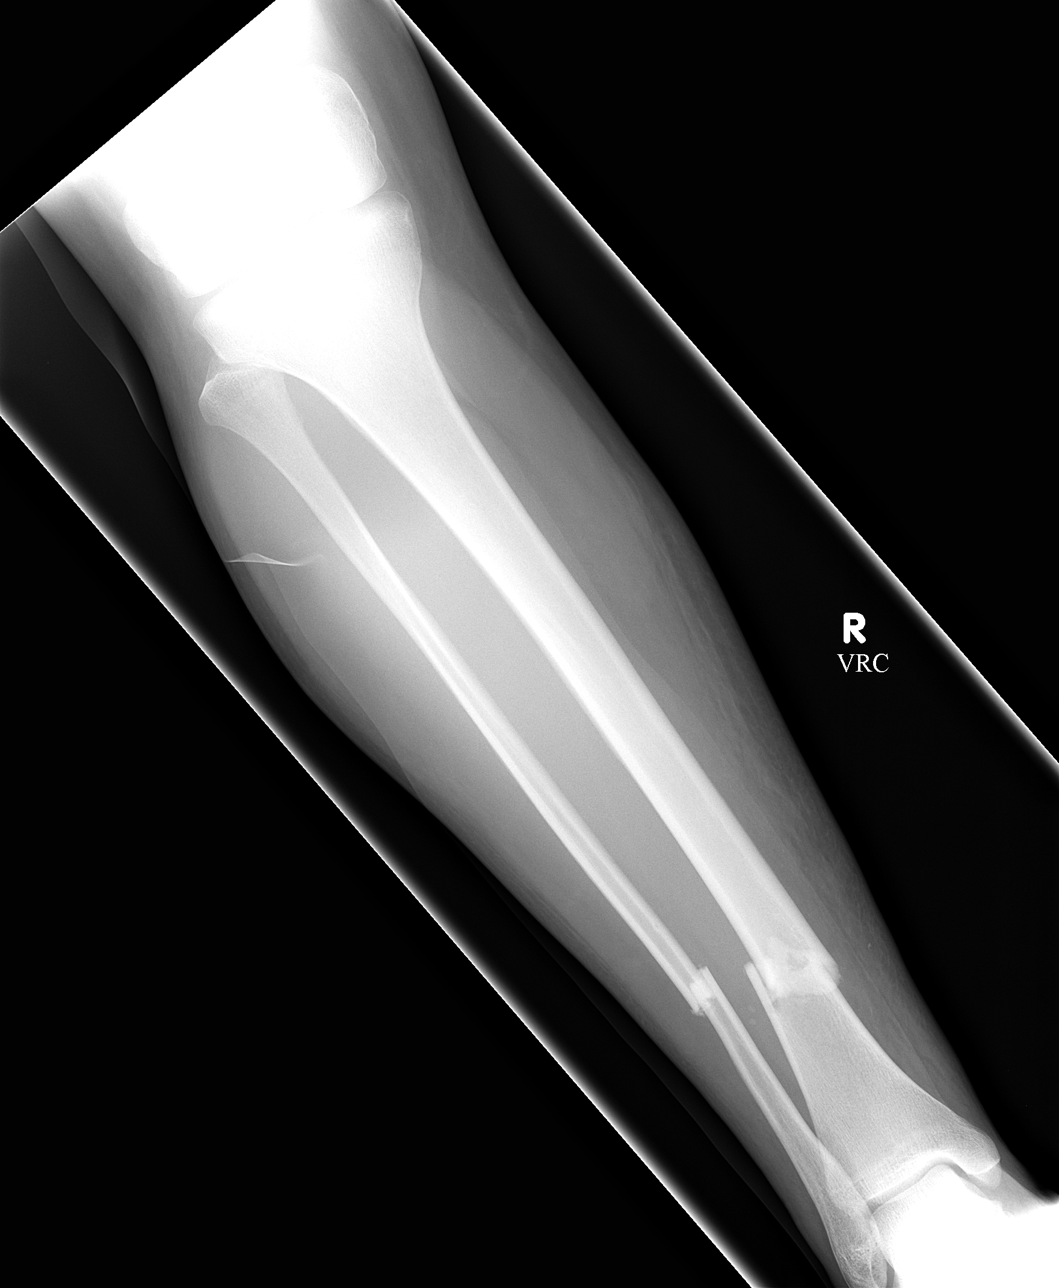

[view not recorded (2 of 3)]
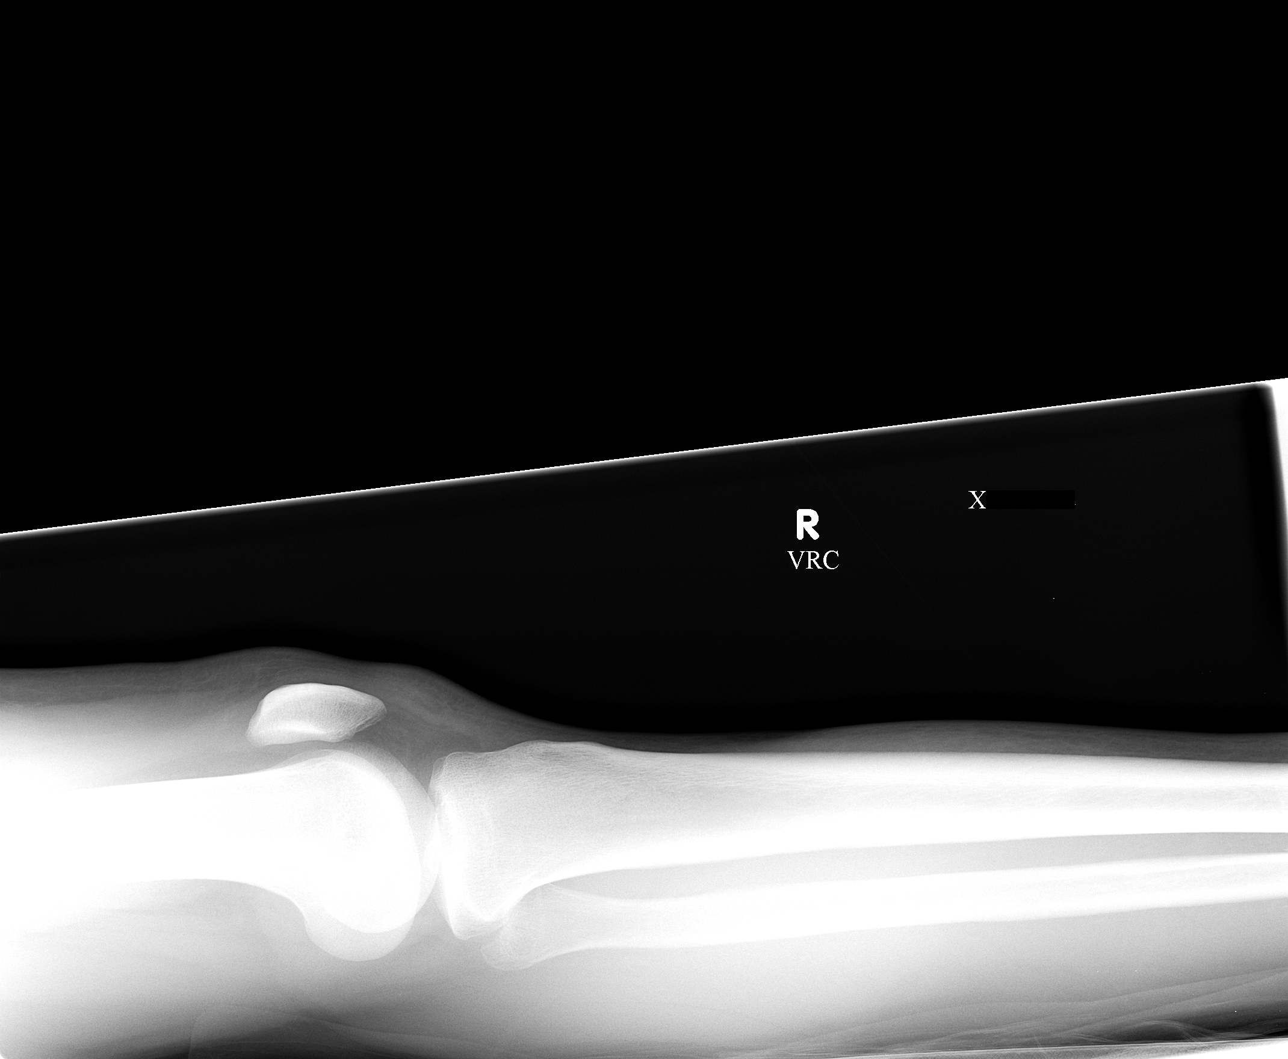

[view not recorded (3 of 3)]
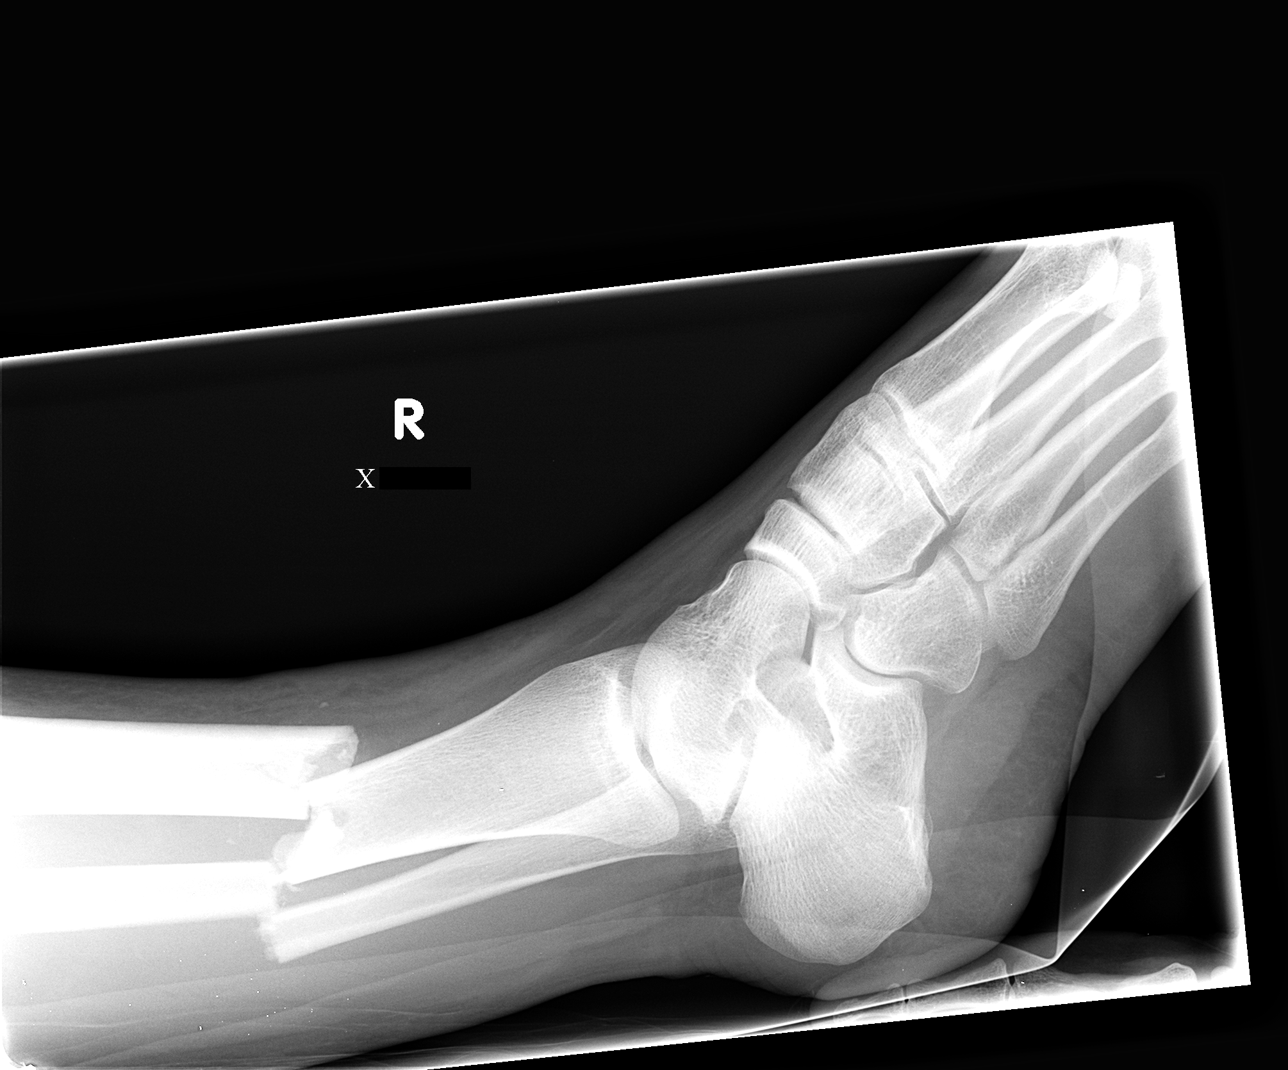

[3 of 3 positions shown; findings below may reference images not displayed]

FINDINGS: Transverse fractures are noted through the mid to distal
tibia and fibula.  There is approximate 1 cm in bony overlap at the
fracture site. Slight posterior displacement of the distal fracture
fragments is noted.  Additionally there is anterior angulation of
the distal fracture fragments at the fracture site.
IMPRESSION: Mid to distal tibia and fibular fracture as described.

## 2014-09-18 IMAGING — RF DG TIBIA/FIBULA 2V*R*
1 series · 9 of 9 positions shown · non-contrast
Comparison: 01/08/2013.

CLINICAL DATA: Tibial and fibular fractures.

RIGHT TIBIA AND FIBULA - 2 VIEW

[Series 1: run · 9 of 9 slices shown]
[im 1/9]
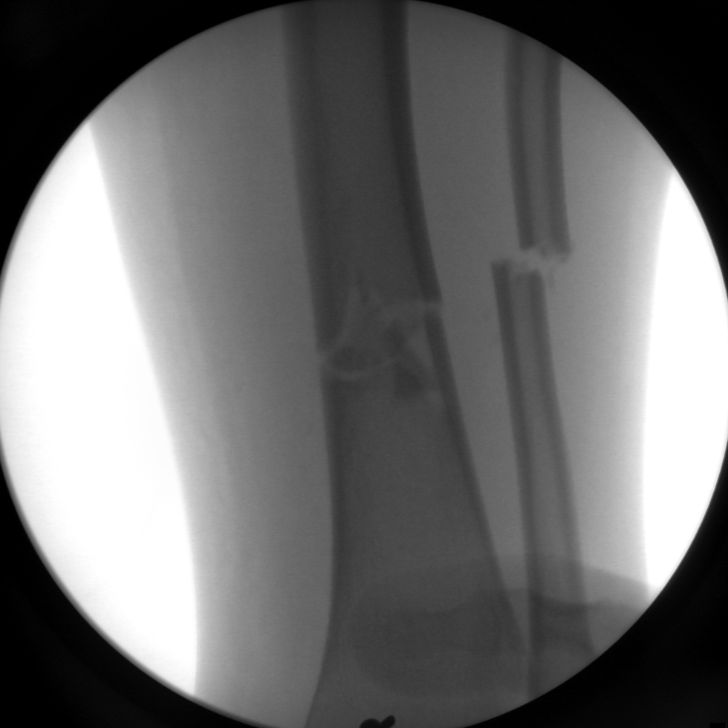
[im 2/9]
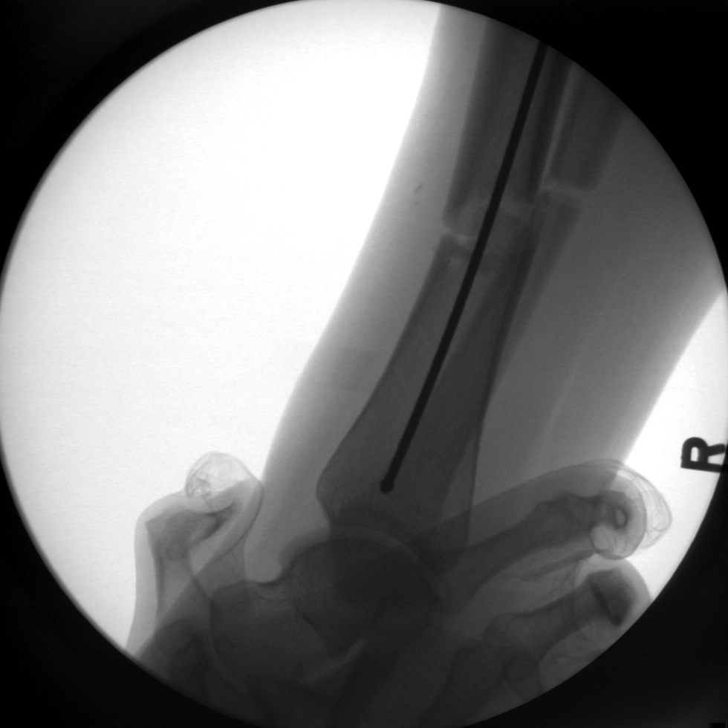
[im 3/9]
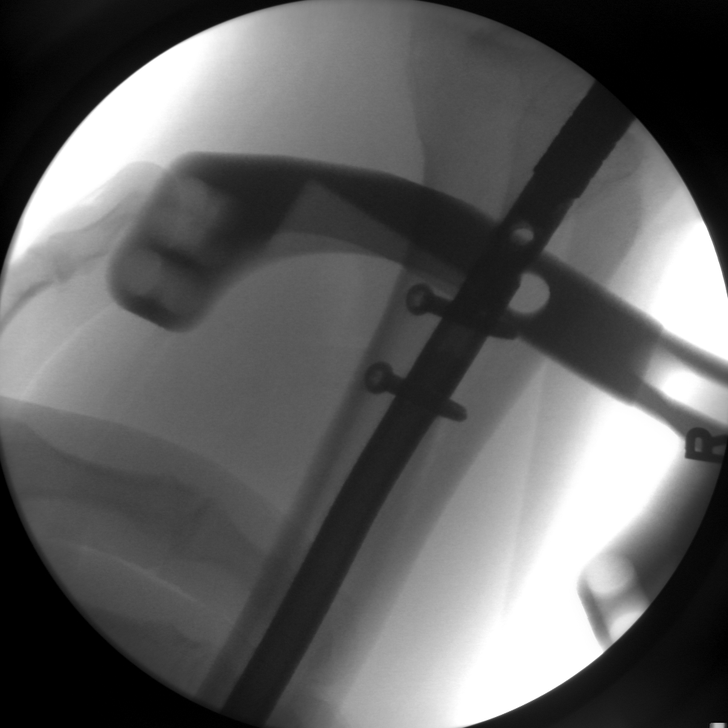
[im 4/9]
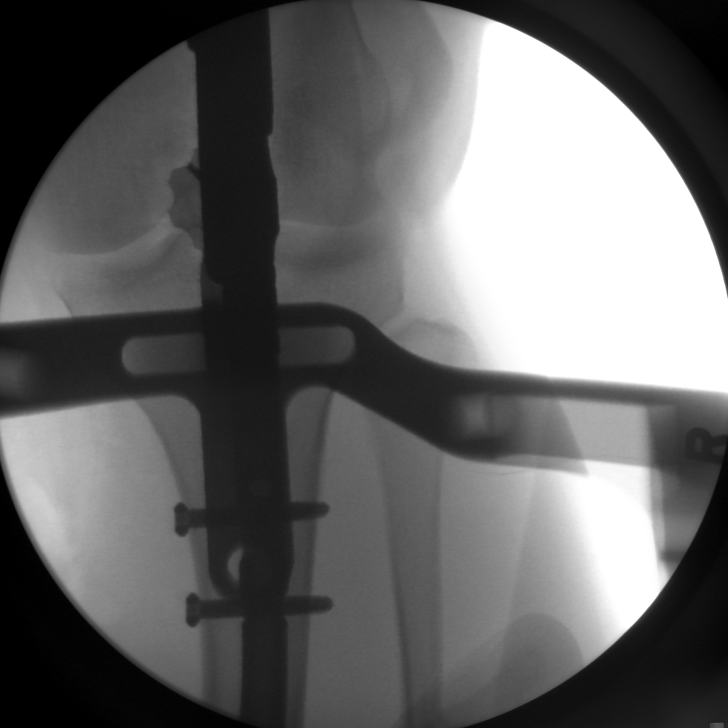
[im 5/9]
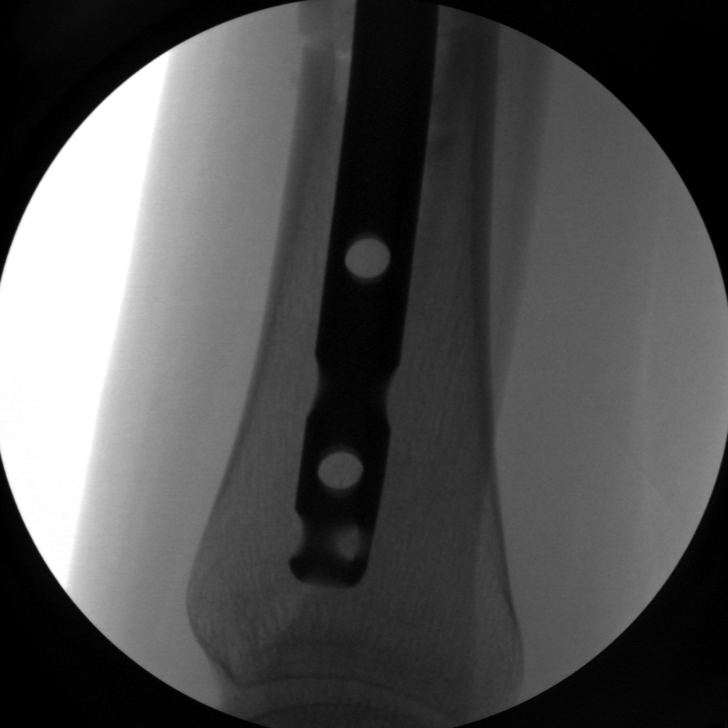
[im 6/9]
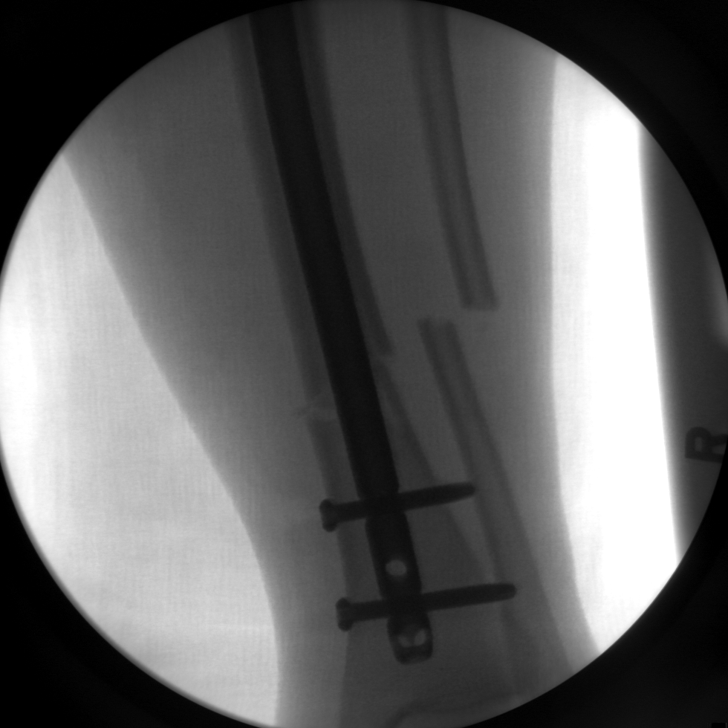
[im 7/9]
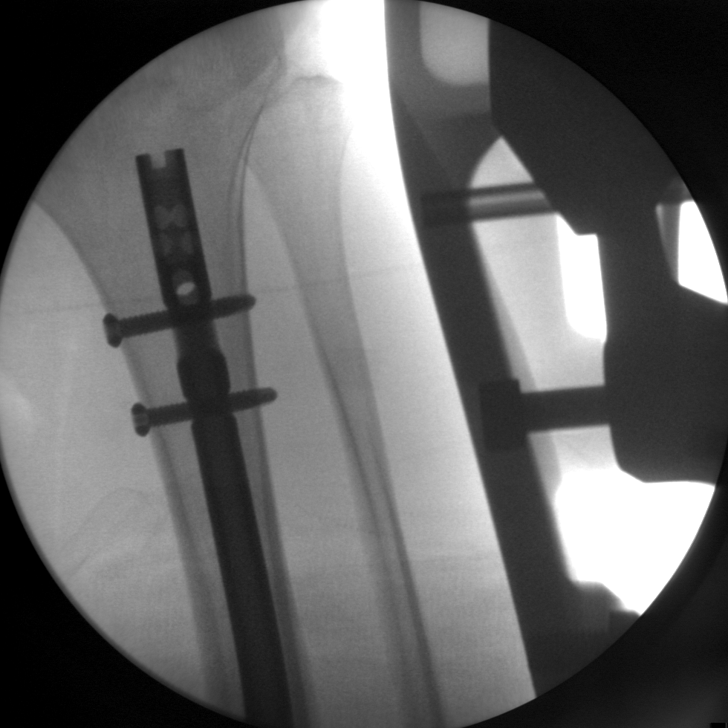
[im 8/9]
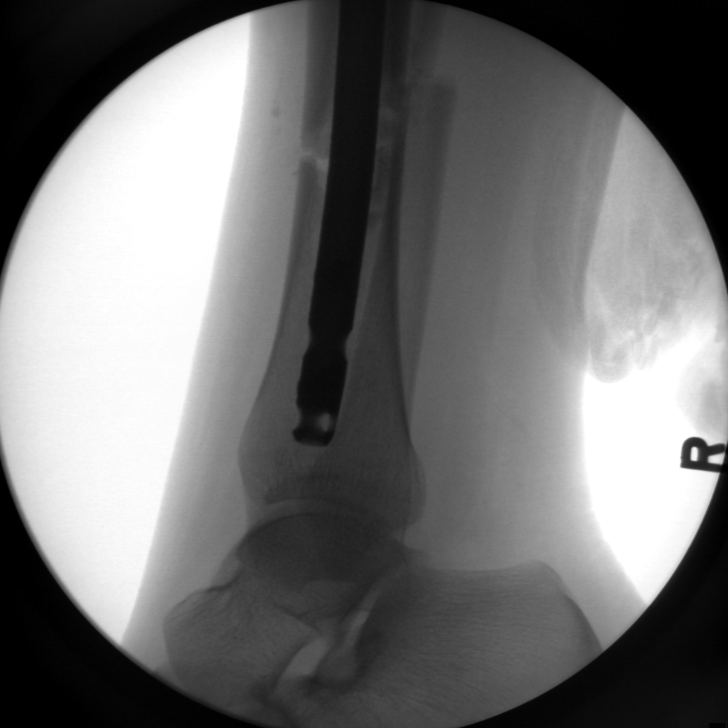
[im 9/9]
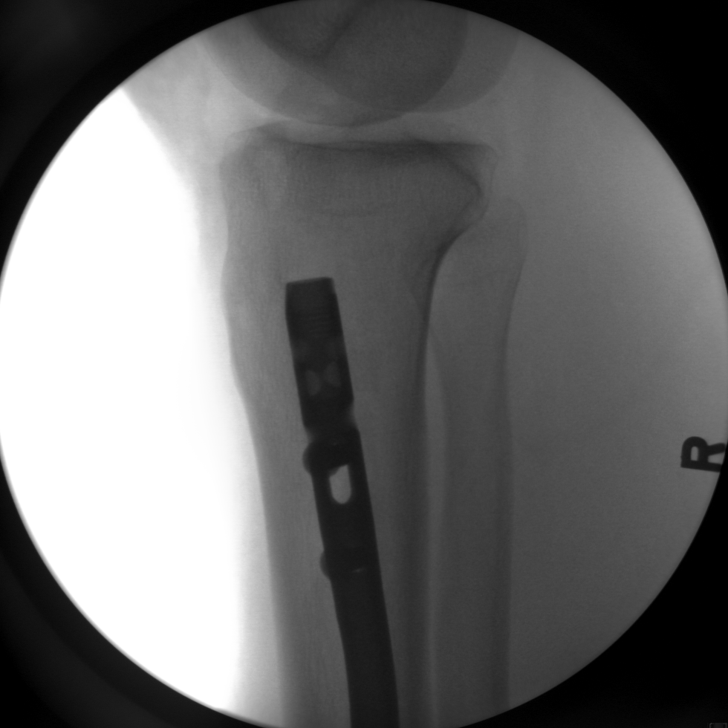

[9 of 9 positions shown; findings below may reference images not displayed]

FINDINGS: Multiple intraoperative views documents a intramedullary
rod placement within the tibia for fixation of a transverse
minimally comminuted and displaced fracture through the distal
third of the tibial diaphysis. Anatomic alignment has been
restored.  Displaced transverse fracture of the distal third of the
fibular diaphysis is noted, appears approximately one and half
shaft width medially displaced.
IMPRESSION: 1.  Intraoperative documentation of ORIF of the right tibial
fracture, as above.
